# Patient Record
Sex: Male | Born: 1975 | Race: Black or African American | Hispanic: No | Marital: Married | Smoking: Former smoker
Health system: Southern US, Community
[De-identification: ages and names within clinical notes are randomized; demographics above are authoritative.]

## PROBLEM LIST (undated history)

## (undated) DIAGNOSIS — B019 Varicella without complication: Secondary | ICD-10-CM

## (undated) HISTORY — DX: Varicella without complication: B01.9

---

## 2007-09-30 ENCOUNTER — Emergency Department: Payer: Self-pay | Admitting: Emergency Medicine

## 2007-10-22 ENCOUNTER — Other Ambulatory Visit: Payer: Self-pay

## 2007-10-22 ENCOUNTER — Emergency Department: Payer: Self-pay | Admitting: Emergency Medicine

## 2008-12-11 ENCOUNTER — Emergency Department: Payer: Self-pay | Admitting: Emergency Medicine

## 2009-04-02 ENCOUNTER — Emergency Department: Payer: Self-pay | Admitting: Unknown Physician Specialty

## 2009-08-13 IMAGING — CT CT CERVICAL SPINE WITHOUT CONTRAST
1 series · 12 of 14 positions shown, 15 images · non-contrast
Comparison: none

REASON FOR EXAM: distracting injuries facial trauma
COMMENTS:   LMP: (Male)

[Series 4: axial · axial · 0.34mm/px · z∈[-277,-44]mm · 12 of 139 slices shown, 15 images]
[im 11/139  soft-tissue]
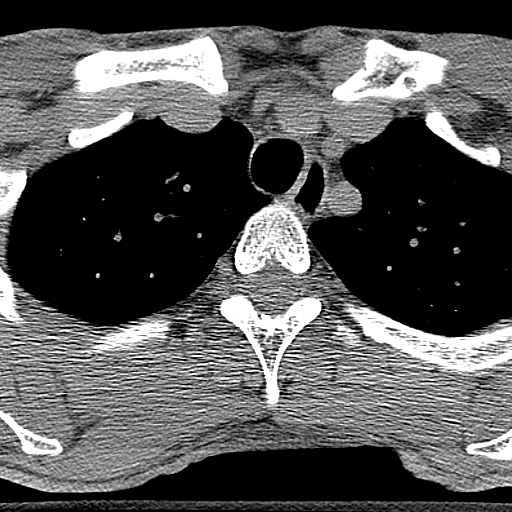
[im 11/139  bone]
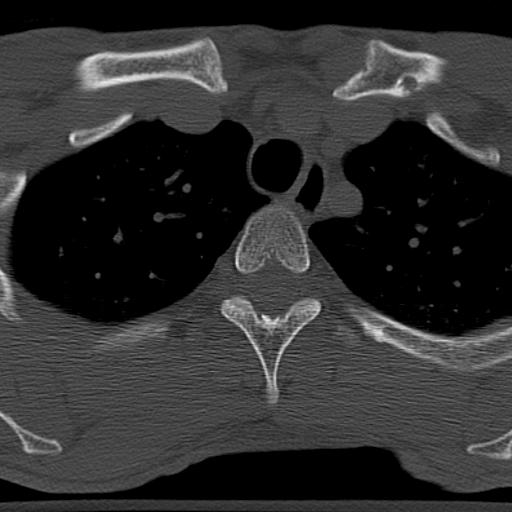
[im 22/139  bone]
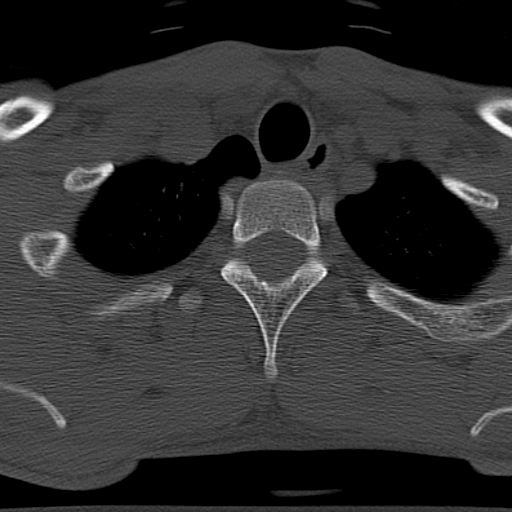
[im 32/139  bone]
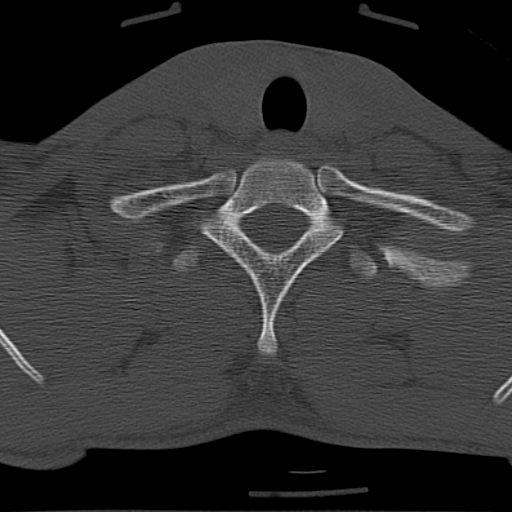
[im 43/139  bone]
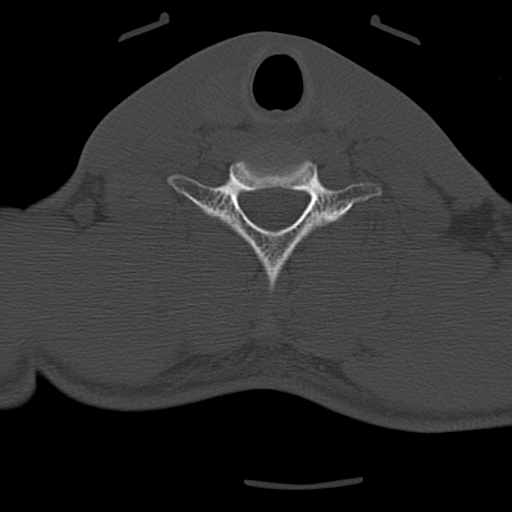
[im 54/139  soft-tissue]
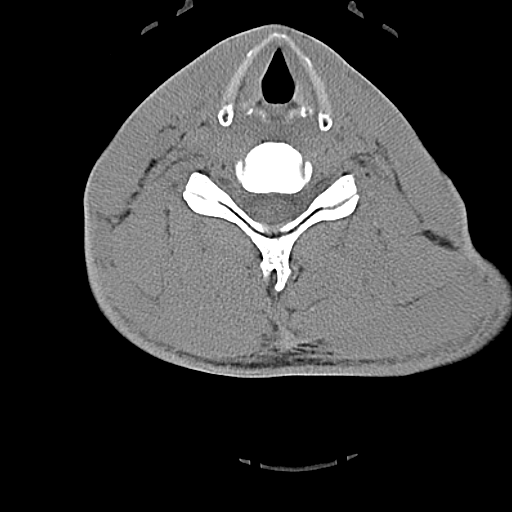
[im 54/139  bone]
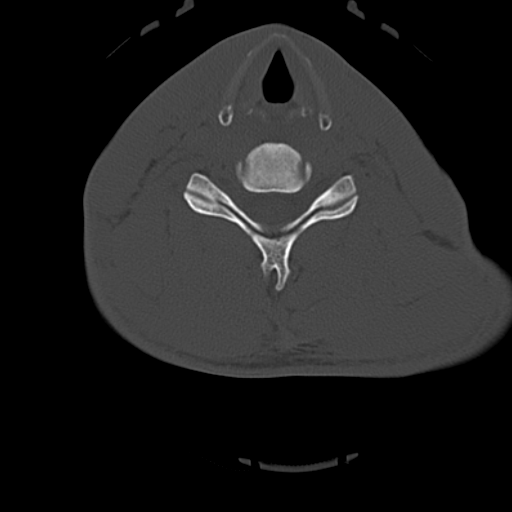
[im 64/139  bone]
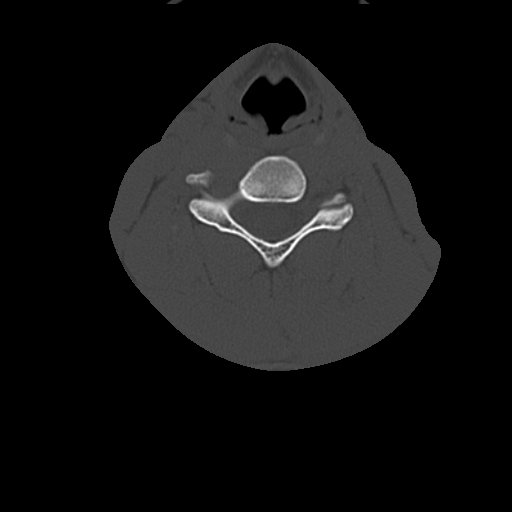
[im 75/139  bone]
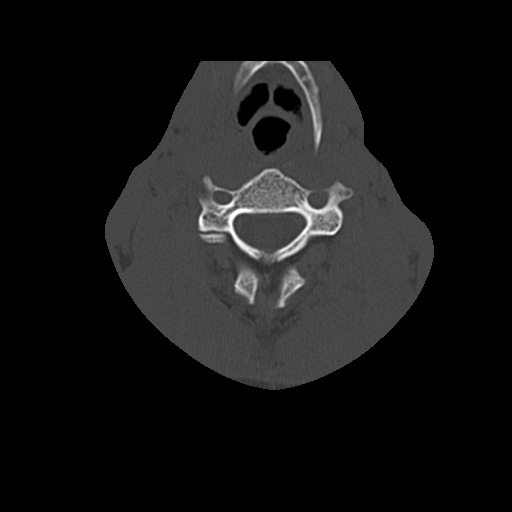
[im 85/139  bone]
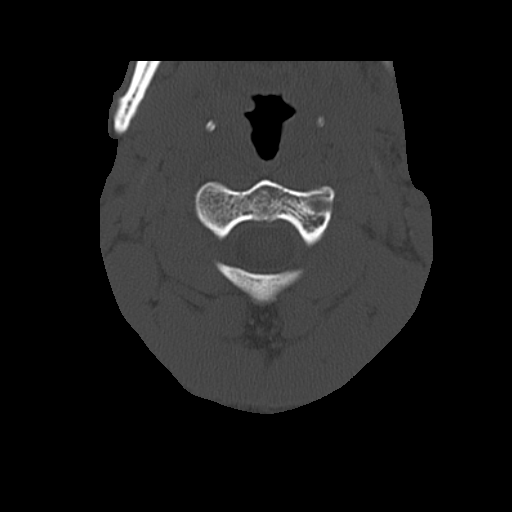
[im 96/139  soft-tissue]
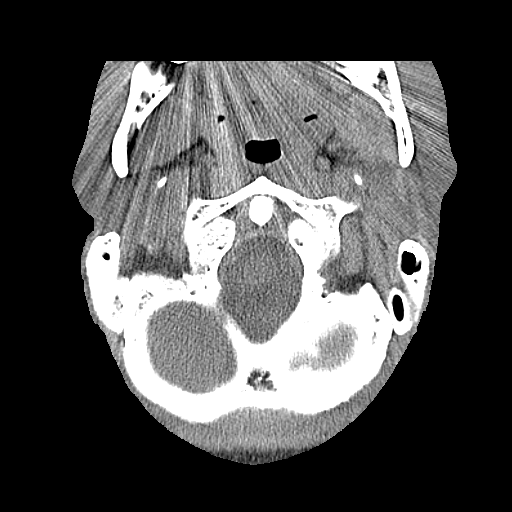
[im 96/139  bone]
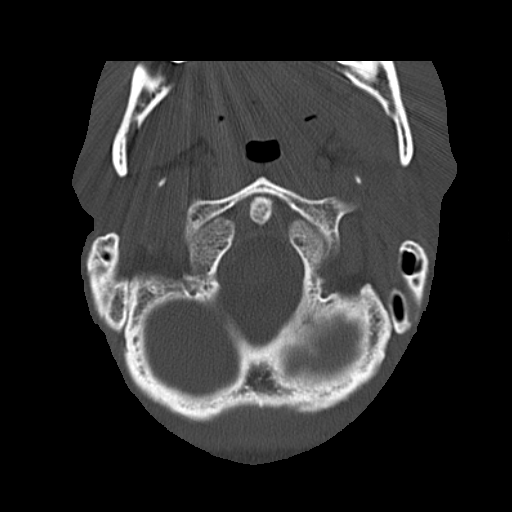
[im 107/139  bone]
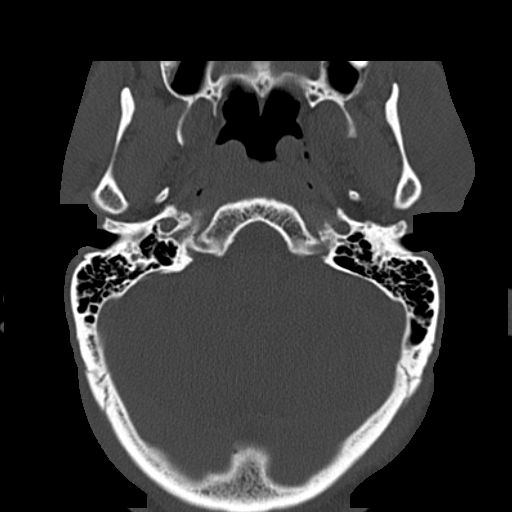
[im 117/139  bone]
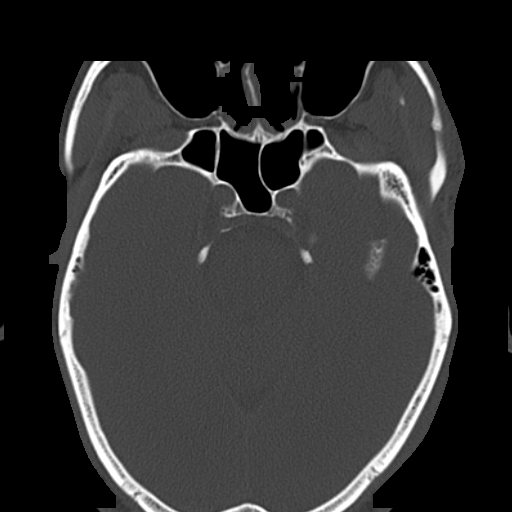
[im 128/139  bone]
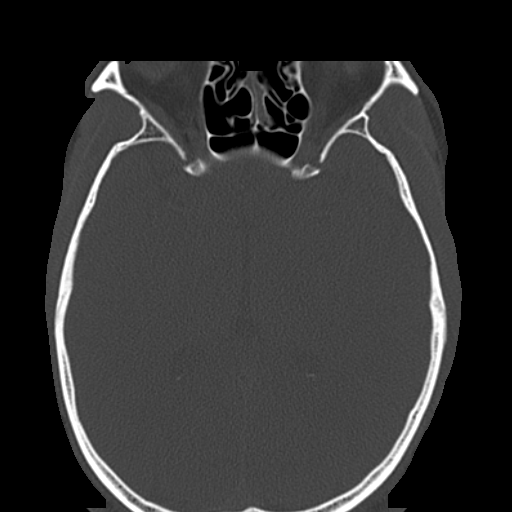

[12 of 14 positions shown; findings below may reference images not displayed]

PROCEDURE:     CT  - CT CERVICAL SPINE WO  - October 22, 2007  [DATE]

RESULT:     Noncontrast emergent CT of the cervical spine is reconstructed
at bone window settings in the axial, coronal and sagittal planes. There is
no previous exam for comparison.

The craniocervical junction is unremarkable. The prevertebral soft tissues
appear to be normal. There is no fracture.
IMPRESSION: No CT evidence of an acute cervical spine bony abnormality.
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
## 2010-12-23 ENCOUNTER — Emergency Department: Payer: Self-pay | Admitting: Emergency Medicine

## 2011-03-20 ENCOUNTER — Emergency Department: Payer: Self-pay | Admitting: Emergency Medicine

## 2012-12-05 ENCOUNTER — Encounter: Payer: Self-pay | Admitting: Adult Health

## 2012-12-05 ENCOUNTER — Ambulatory Visit (INDEPENDENT_AMBULATORY_CARE_PROVIDER_SITE_OTHER): Payer: 59 | Admitting: Adult Health

## 2012-12-05 ENCOUNTER — Encounter: Payer: Self-pay | Admitting: *Deleted

## 2012-12-05 VITALS — BP 118/78 | HR 60 | Temp 98.1°F | Resp 12 | Ht 71.75 in | Wt 165.0 lb

## 2012-12-05 DIAGNOSIS — M79609 Pain in unspecified limb: Secondary | ICD-10-CM

## 2012-12-05 DIAGNOSIS — M79671 Pain in right foot: Secondary | ICD-10-CM | POA: Insufficient documentation

## 2012-12-05 DIAGNOSIS — Z0000 Encounter for general adult medical examination without abnormal findings: Secondary | ICD-10-CM | POA: Insufficient documentation

## 2012-12-05 DIAGNOSIS — L84 Corns and callosities: Secondary | ICD-10-CM

## 2012-12-05 LAB — LIPID PANEL
Total CHOL/HDL Ratio: 3
Triglycerides: 50 mg/dL (ref 0.0–149.0)

## 2012-12-05 LAB — BASIC METABOLIC PANEL
CO2: 31 mEq/L (ref 19–32)
Chloride: 107 mEq/L (ref 96–112)
Creatinine, Ser: 1.2 mg/dL (ref 0.4–1.5)
Potassium: 4.3 mEq/L (ref 3.5–5.1)

## 2012-12-05 LAB — HEPATIC FUNCTION PANEL
ALT: 22 U/L (ref 0–53)
AST: 21 U/L (ref 0–37)
Alkaline Phosphatase: 55 U/L (ref 39–117)
Bilirubin, Direct: 0.1 mg/dL (ref 0.0–0.3)
Total Protein: 6.7 g/dL (ref 6.0–8.3)

## 2012-12-05 LAB — CBC WITH DIFFERENTIAL/PLATELET
Basophils Relative: 0.4 % (ref 0.0–3.0)
Eosinophils Absolute: 0.4 10*3/uL (ref 0.0–0.7)
Lymphs Abs: 2.4 10*3/uL (ref 0.7–4.0)
MCHC: 33.3 g/dL (ref 30.0–36.0)
MCV: 85.7 fl (ref 78.0–100.0)
Monocytes Absolute: 0.5 10*3/uL (ref 0.1–1.0)
Neutrophils Relative %: 46.9 % (ref 43.0–77.0)
Platelets: 181 10*3/uL (ref 150.0–400.0)
RBC: 4.94 Mil/uL (ref 4.22–5.81)

## 2012-12-05 MED ORDER — MELOXICAM 15 MG PO TABS: 15.0000 mg | ORAL_TABLET | Freq: Every day | ORAL | Status: DC

## 2012-12-05 NOTE — Assessment & Plan Note (Signed)
Patient with multiple calluses and corns on bilateral feet. He also has bilateral bunions. He has seen a podiatrist in [facility] however he would like to be established with one locally. Patient is experiencing considerable pain on a regular basis. Start meloxicam 15 mg daily. Refer to podiatry. Patient requesting referral to Dr. Orland Jarred.

## 2012-12-05 NOTE — Assessment & Plan Note (Signed)
Normal physical exam except for other problems listed separately. Check labs: CBC with differential, basic metabolic panel, hepatic panel, lipids.

## 2012-12-05 NOTE — Progress Notes (Signed)
Subjective:    Patient ID: Danny Patton, male    DOB: 1975-08-13, 37 y.o.   MRN: [id number]  HPI  Patient is a pleasant 37 year old male who presents to clinic to establish care. He has not had any regular PCP since his pediatrician. Patient is having significant pain in bilateral feet. He has been followed by a podiatrist in [facility] but he finds it difficult to be seen at this clinic. He is requesting a referral to a local podiatrist.    Past Medical History  Diagnosis Date  . Chicken pox     History reviewed. No pertinent past surgical history.   Family History  Problem Relation Age of Onset  . Stroke Maternal Grandmother   . Heart disease Paternal Grandfather      History   Social History  . Marital Status: Married    Spouse Name: N/A    Number of Children: 2  . Years of Education: 14   Occupational History  . Maintenance     [employer]   Social History Main Topics  . Smoking status: Former Smoker    Quit date: 03/30/1997  . Smokeless tobacco: Never Used  . Alcohol Use: 2.4 oz/week    4 Cans of beer per week     Comment: occasionally  . Drug Use: No  . Sexual Activity: Yes   Other Topics Concern  . Not on file   Social History Narrative   [name] grew up in [place], [place]. He attended [employer] and obtained an Associates degree in [place].  He has 2 biological children from a previous relationship (27 y/o girl who lives with her mother & 7 month of girl from his current marriage). He lives at home with his wife and her 28 & 33 y/o daughters and their 39 month old daughter. His wife also has a 48 y/o daughter who lives with a cousin and a 64 y/o son who lives with his dad. All live in [place] and they see each other frequently.  [name] works in Production designer, theatre/television/film for [employer]. He has not been able to find employment in his field of study.       Review of Systems  Constitutional: Negative.   HENT: Negative.   Eyes:  Negative.   Respiratory: Negative.   Cardiovascular: Negative.   Gastrointestinal: Negative.   Endocrine: Negative.   Genitourinary: Negative.   Musculoskeletal:       Bilateral feet pain. Multiple corns and callus  Skin: Negative.   Allergic/Immunologic:       Mild seasonal allergies  Neurological: Negative.   Hematological: Negative.   Psychiatric/Behavioral: Negative.    BP 118/78  Pulse 60  Temp(Src) 98.1 F (36.7 C) (Oral)  Resp 12  Ht 5' 11.75" (1.822 m)  Wt 165 lb (74.844 kg)  BMI 22.55 kg/m2  SpO2 98%     Objective:   Physical Exam  Constitutional: He is oriented to person, place, and time. He appears well-developed and well-nourished. No distress.  HENT:  Head: Normocephalic and atraumatic.  Right Ear: External ear normal.  Left Ear: External ear normal.  Nose: Nose normal.  Mouth/Throat: Oropharynx is clear and moist.  Eyes: Conjunctivae and EOM are normal. Pupils are equal, round, and reactive to light.  Neck: Normal range of motion. Neck supple.  Cardiovascular: Normal rate, regular rhythm, normal heart sounds and intact distal pulses.  Exam reveals no gallop and no friction rub.   No murmur heard. Pulmonary/Chest: Effort normal  and breath sounds normal. No respiratory distress. He has no wheezes. He has no rales. He exhibits no tenderness.  Abdominal: Soft. Bowel sounds are normal.  Musculoskeletal: Normal range of motion. He exhibits no edema and no tenderness.  Lymphadenopathy:    He has no cervical adenopathy.  Neurological: He is alert and oriented to person, place, and time. He has normal reflexes.  Skin: Skin is warm and dry.     3 tattoos done himself. Did not share needles with anyone.  Psychiatric: He has a normal mood and affect. His behavior is normal. Judgment and thought content normal.       Assessment & Plan:

## 2012-12-05 NOTE — Patient Instructions (Addendum)
   Thank you for choosing [facility] for your health care needs.  Please have your labs drawn prior to leaving the office.  The results will be available through MyChart for your convenience. Please remember to activate this. The activation code is located at the end of this form.  I have sent in a prescription for Mobic to help with your feet pain.  I am also referring your to Dr. Orland Jarred (Podiatry) and the local Pain Clinic.  Please call with any questions or concerns.

## 2013-01-05 ENCOUNTER — Ambulatory Visit: Payer: Self-pay | Admitting: Pain Medicine

## 2013-01-06 ENCOUNTER — Telehealth: Payer: Self-pay | Admitting: Adult Health

## 2013-01-06 NOTE — Telephone Encounter (Signed)
Asking if we have heard from [facility], Dr. Metta Clines.  States they were going to fax [place] paperwork to have pt referred to them.  Pt wants to go ahead and be referred.

## 2013-01-06 NOTE — Telephone Encounter (Signed)
Do you know anything about this? 

## 2013-01-09 NOTE — Telephone Encounter (Signed)
I called patient to clarify what was going on. Patient states he seen Dr. Metta Clines who told him that he should go to [facility] for further eval. He also stated Dr. Metta Clines told him we should set this up. Please advise.

## 2013-01-11 NOTE — Telephone Encounter (Signed)
Left message for pt to return my call.

## 2013-01-11 NOTE — Telephone Encounter (Signed)
Pt saw Dr. Metta Clines, recommended referral to [facility], but his office did not make referral, requesting [place] to. States the pain in his feet is beginning to cause back and neck pain as well and that is why Dr. Metta Clines would like to refer him to [facility]. Advised pt that Raquel was out of the office this week and he is fine with waiting.

## 2013-01-11 NOTE — Telephone Encounter (Signed)
Pt returned call

## 2013-01-16 ENCOUNTER — Other Ambulatory Visit: Payer: Self-pay | Admitting: Adult Health

## 2013-01-16 DIAGNOSIS — G8929 Other chronic pain: Secondary | ICD-10-CM

## 2013-01-16 NOTE — Telephone Encounter (Signed)
Referral entered for [facility]

## 2013-01-25 ENCOUNTER — Telehealth: Payer: Self-pay | Admitting: Adult Health

## 2013-01-25 NOTE — Telephone Encounter (Signed)
LVM for [facility] to return my call

## 2013-01-25 NOTE — Telephone Encounter (Signed)
Pt calling to check on referral to [facility].

## 2013-01-25 NOTE — Telephone Encounter (Signed)
Notified pt that Amber had left a message for [facility] today and that we would notify him when she hears about an appointment. Verbalized understanding.

## 2013-03-20 ENCOUNTER — Ambulatory Visit: Payer: 59 | Admitting: Adult Health

## 2013-03-21 ENCOUNTER — Encounter: Payer: Self-pay | Admitting: Adult Health

## 2013-03-21 ENCOUNTER — Ambulatory Visit (INDEPENDENT_AMBULATORY_CARE_PROVIDER_SITE_OTHER): Payer: 59 | Admitting: Adult Health

## 2013-03-21 ENCOUNTER — Encounter (INDEPENDENT_AMBULATORY_CARE_PROVIDER_SITE_OTHER): Payer: Self-pay

## 2013-03-21 VITALS — BP 120/72 | HR 80 | Temp 97.8°F | Resp 14 | Ht 73.0 in | Wt 165.0 lb

## 2013-03-21 DIAGNOSIS — Z0289 Encounter for other administrative examinations: Secondary | ICD-10-CM

## 2013-03-21 DIAGNOSIS — R300 Dysuria: Secondary | ICD-10-CM | POA: Insufficient documentation

## 2013-03-21 DIAGNOSIS — Z021 Encounter for pre-employment examination: Secondary | ICD-10-CM | POA: Insufficient documentation

## 2013-03-21 LAB — POCT URINALYSIS DIPSTICK
Ketones, UA: NEGATIVE
Protein, UA: NEGATIVE
Spec Grav, UA: >=1.03
Urobilinogen, UA: 0.2
pH, UA: 6

## 2013-03-21 LAB — LIPID PANEL
Cholesterol: 105 mg/dL (ref 0–200)
LDL Cholesterol: 64 mg/dL (ref 0–99)
Triglycerides: 48 mg/dL (ref 0.0–149.0)
VLDL: 9.6 mg/dL (ref 0.0–40.0)

## 2013-03-21 MED ORDER — LEVOFLOXACIN 500 MG PO TABS: ORAL_TABLET | ORAL | Status: DC

## 2013-03-21 NOTE — Assessment & Plan Note (Signed)
Patient needing form filled out for insurance purposes for employer. Need to draw glucose and cholesterol even though this was done in September when he had his physical. Form will be completed once results obtained.

## 2013-03-21 NOTE — Progress Notes (Signed)
   Subjective:    Patient ID: Danny Patton, male    DOB: November 17, 1975, 37 y.o.   MRN: [id number]  HPI  Danny Patton is a pleasant 37 year old male who presents to clinic for the following reasons:  1. He needs forms filled out for his employer. This is for insurance purposes in order to decrease his premiums. 2. He is having dysuria. Reports frequency and urgency. Denies fever, chills, discharge. He is sexually active with multiple partners.  Current Outpatient Prescriptions on File Prior to Visit  Medication Sig Dispense Refill  . meloxicam (MOBIC) 15 MG tablet Take 1 tablet (15 mg total) by mouth daily.  30 tablet  0   No current facility-administered medications on file prior to visit.     Review of Systems  Constitutional: Negative.   Respiratory: Negative.   Cardiovascular: Negative.   Gastrointestinal: Negative.   Genitourinary: Positive for dysuria, urgency and frequency. Negative for hematuria, flank pain and discharge.  Psychiatric/Behavioral: Negative.   All other systems reviewed and are negative.       Objective:   Physical Exam  Constitutional: He is oriented to person, place, and time. He appears well-developed and well-nourished. No distress.  Cardiovascular: Normal rate, regular rhythm, normal heart sounds and intact distal pulses.  Exam reveals no gallop and no friction rub.   No murmur heard. Pulmonary/Chest: Effort normal and breath sounds normal. No respiratory distress. He has no wheezes. He has no rales. He exhibits no tenderness.  Abdominal: Soft. Bowel sounds are normal.  Genitourinary:  Urinary frequency, hesitancy and urgency. No penile discharge.  Neurological: He is alert and oriented to person, place, and time.  Skin: Skin is warm and dry.  Psychiatric: He has a normal mood and affect. His behavior is normal. Judgment and thought content normal.    BP 120/72  Pulse 80  Temp(Src) 97.8 F (36.6 C) (Oral)  Resp 14  Ht 6\' 1"  (1.854 m)  Wt 165  lb (74.844 kg)  BMI 21.77 kg/m2  SpO2 97%        Assessment & Plan:

## 2013-03-21 NOTE — Assessment & Plan Note (Addendum)
Check UA, gonorrhea/chlamidia. Send urine for culture. Start levaquin 500 mg daily x 14 days.

## 2013-03-21 NOTE — Progress Notes (Signed)
Pre visit review using our clinic review tool, if applicable. No additional management support is needed unless otherwise documented below in the visit note. 

## 2013-03-22 LAB — URINE CULTURE: Organism ID, Bacteria: NO GROWTH

## 2017-03-21 ENCOUNTER — Emergency Department
Admission: EM | Admit: 2017-03-21 | Discharge: 2017-03-21 | Disposition: A | Discharge status: Home / Self Care | Payer: Self-pay | Attending: Emergency Medicine | Admitting: Emergency Medicine

## 2017-03-21 ENCOUNTER — Encounter: Payer: Self-pay | Admitting: Emergency Medicine

## 2017-03-21 ENCOUNTER — Other Ambulatory Visit: Payer: Self-pay

## 2017-03-21 DIAGNOSIS — R451 Restlessness and agitation: Secondary | ICD-10-CM | POA: Insufficient documentation

## 2017-03-21 DIAGNOSIS — Y902 Blood alcohol level of 40-59 mg/100 ml: Secondary | ICD-10-CM | POA: Insufficient documentation

## 2017-03-21 DIAGNOSIS — Z7289 Other problems related to lifestyle: Secondary | ICD-10-CM

## 2017-03-21 DIAGNOSIS — Z791 Long term (current) use of non-steroidal anti-inflammatories (NSAID): Secondary | ICD-10-CM | POA: Insufficient documentation

## 2017-03-21 DIAGNOSIS — Z87891 Personal history of nicotine dependence: Secondary | ICD-10-CM | POA: Insufficient documentation

## 2017-03-21 DIAGNOSIS — Z789 Other specified health status: Secondary | ICD-10-CM

## 2017-03-21 DIAGNOSIS — R4689 Other symptoms and signs involving appearance and behavior: Secondary | ICD-10-CM | POA: Insufficient documentation

## 2017-03-21 DIAGNOSIS — F1010 Alcohol abuse, uncomplicated: Secondary | ICD-10-CM | POA: Insufficient documentation

## 2017-03-21 LAB — URINALYSIS, ROUTINE W REFLEX MICROSCOPIC
Bilirubin Urine: NEGATIVE
GLUCOSE, UA: NEGATIVE mg/dL
HGB URINE DIPSTICK: NEGATIVE
Ketones, ur: NEGATIVE mg/dL
LEUKOCYTES UA: NEGATIVE
NITRITE: NEGATIVE
PROTEIN: 100 mg/dL — AB
SPECIFIC GRAVITY, URINE: 1.02 (ref 1.005–1.030)
pH: 6 (ref 5.0–8.0)

## 2017-03-21 LAB — COMPREHENSIVE METABOLIC PANEL
ALT: 35 U/L (ref 17–63)
ANION GAP: 17 — AB (ref 5–15)
AST: 35 U/L (ref 15–41)
Albumin: 4.3 g/dL (ref 3.5–5.0)
Alkaline Phosphatase: 58 U/L (ref 38–126)
BILIRUBIN TOTAL: 0.3 mg/dL (ref 0.3–1.2)
BUN: 17 mg/dL (ref 6–20)
CHLORIDE: 105 mmol/L (ref 101–111)
CO2: 16 mmol/L — ABNORMAL LOW (ref 22–32)
Calcium: 8.8 mg/dL — ABNORMAL LOW (ref 8.9–10.3)
Creatinine, Ser: 1.45 mg/dL — ABNORMAL HIGH (ref 0.61–1.24)
GFR calc Af Amer: >60 mL/min (ref >60)
GFR, EST NON AFRICAN AMERICAN: 60 mL/min — AB (ref >60)
Glucose, Bld: 189 mg/dL — ABNORMAL HIGH (ref 65–99)
POTASSIUM: 3.2 mmol/L — AB (ref 3.5–5.1)
Sodium: 138 mmol/L (ref 135–145)
TOTAL PROTEIN: 7 g/dL (ref 6.5–8.1)

## 2017-03-21 LAB — CBC
HEMATOCRIT: 42.7 % (ref 40.0–52.0)
Hemoglobin: 14.3 g/dL (ref 13.0–18.0)
MCH: 29.1 pg (ref 26.0–34.0)
MCHC: 33.6 g/dL (ref 32.0–36.0)
MCV: 86.6 fL (ref 80.0–100.0)
PLATELETS: 173 10*3/uL (ref 150–440)
RBC: 4.93 MIL/uL (ref 4.40–5.90)
RDW: 14.9 % — AB (ref 11.5–14.5)
WBC: 10.1 10*3/uL (ref 3.8–10.6)

## 2017-03-21 LAB — URINE DRUG SCREEN, QUALITATIVE (ARMC ONLY)
Amphetamines, Ur Screen: NOT DETECTED
BARBITURATES, UR SCREEN: NOT DETECTED
BENZODIAZEPINE, UR SCRN: NOT DETECTED
CANNABINOID 50 NG, UR ~~LOC~~: POSITIVE — AB
COCAINE METABOLITE, UR ~~LOC~~: POSITIVE — AB
MDMA (Ecstasy)Ur Screen: NOT DETECTED
METHADONE SCREEN, URINE: NOT DETECTED
Opiate, Ur Screen: NOT DETECTED
Phencyclidine (PCP) Ur S: NOT DETECTED
TRICYCLIC, UR SCREEN: NOT DETECTED

## 2017-03-21 LAB — ETHANOL: ALCOHOL ETHYL (B): 56 mg/dL — AB (ref <10)

## 2017-03-21 LAB — ACETAMINOPHEN LEVEL

## 2017-03-21 LAB — SALICYLATE LEVEL

## 2017-03-21 MED ORDER — LORAZEPAM 2 MG/ML IJ SOLN
2.0000 mg | Freq: Once | INTRAMUSCULAR | Status: AC
  Administered 2017-03-21: 2 mg via INTRAMUSCULAR

## 2017-03-21 MED ORDER — ZIPRASIDONE MESYLATE 20 MG IM SOLR
20.0000 mg | Freq: Once | INTRAMUSCULAR | Status: AC
  Administered 2017-03-21: 20 mg via INTRAMUSCULAR

## 2017-03-21 NOTE — ED Notes (Signed)
BEHAVIORAL HEALTH ROUNDING Patient sleeping: Yes.   Patient alert and oriented: not applicable SLEEPING Behavior appropriate: Yes.  ; If no, describe: SLEEPING Nutrition and fluids offered: No SLEEPING Toileting and hygiene offered: NoSLEEPING Sitter present: not applicable, Q 15 min safety rounds and observation. Law enforcement present: Yes ODS 

## 2017-03-21 NOTE — BH Assessment (Signed)
Pt administered IM sedation for aggression.  TTS unable to be completed at this time.

## 2017-03-21 NOTE — ED Provider Notes (Signed)
[facility]  Time seen: 12:32 AM  I have reviewed the triage vital signs and the nursing notes.   HISTORY  Chief Complaint No chief complaint on file.    HPI Danny Patton is a 41 y.o. male with no known past medical history who presents to the emergency department with agitation.  According to the patient he came to the ER because he just was not feeling right, per staff the patient was pacing around the emergency department waiting room and became agitated and started yelling, ripped his coat off and ran outside to the EMS bay continued yelling and agitation.  Police officers intervened and brought the patient into the emergency department in handcuffs.  Patient does appear acutely agitated, denies any psychiatric history.  Admits to alcohol use and some drug use but will not elaborate as to how much he drank or what drugs he might of taken.  Patient denies any chest pain abdominal pain, negative review of systems.  Denies any psychiatric history.  Patient initially calm and cooperative while in handcuffs however once ankles were removed patient once again became very agitated yelling loudly saying he needs to get some fresh air trying to leave the room, officers once again intervene.   Past Medical History:  Diagnosis Date  . Chicken pox     Patient Active Problem List   Diagnosis Date Noted  . Dysuria 03/21/2013  . Pre-employment examination 03/21/2013  . Routine general medical examination at a health care facility 12/05/2012  . Pain in both feet 12/05/2012    No past surgical history on file.  Prior to Admission medications   Medication Sig Start Date End Date Taking? Authorizing Provider  levofloxacin (LEVAQUIN) 500 MG tablet Take 1 tablet daily for 14 days. 03/21/13   Rey, Richarda Overlieaquel M, NP  meloxicam (MOBIC) 15 MG tablet Take 1 tablet (15 mg total) by mouth daily. 12/05/12   Rey, Richarda Overlieaquel M, NP    No Known  Allergies  Family History  Problem Relation Age of Onset  . Stroke Maternal Grandmother   . Heart disease Paternal Grandfather     Social History Social History   Tobacco Use  . Smoking status: Former Smoker    Last attempt to quit: 03/30/1997    Years since quitting: 19.9  . Smokeless tobacco: Never Used  Substance Use Topics  . Alcohol use: Yes    Alcohol/week: 2.4 oz    Types: 4 Cans of beer per week    Comment: occasionally  . Drug use: No    Review of Systems Constitutional: Negative for fever. Cardiovascular: Negative for chest pain. Respiratory: Negative for shortness of breath. Gastrointestinal: Negative for abdominal pain Neurological: Negative for headache All other ROS negative  ____________________________________________   PHYSICAL EXAM:  Constitutional: Alert, initially calm and cooperative while lying in bed in handcuffs.  Once an cost removed patient became acutely agitated screaming loudly although not threatening anybody. Eyes: Normal exam ENT   Head: Normocephalic and atraumatic.   Mouth/Throat: Mucous membranes are moist. Cardiovascular: Normal rate, regular rhythm. No murmur Respiratory: Normal respiratory effort without tachypnea nor retractions. Breath sounds are clear  Gastrointestinal: Soft and nontender. No distention. Musculoskeletal: Nontender with normal range of motion in all extremities.  Neurologic:  Normal speech and language. No gross focal neurologic deficits  Skin:  Skin is warm, dry and intact.  Psychiatric: Mood and affect are normal.  ____________________________________________   INITIAL IMPRESSION / ASSESSMENT AND PLAN /  ED COURSE  Pertinent labs & imaging results that were available during my care of the patient were reviewed by me and considered in my medical decision making (see chart for details).  Patient presents to the emergency department with agitation/combative behavior.  Patient was taken into the ED in  handcuffs.  Differential would include intoxication, substance use/abuse, psychiatric illness.  We will place the patient under IVC.  Patient very agitated currently we will dose 20 mg of Geodon and 2 mg of Ativan IM for patient sedation.  EKG reviewed and interpreted by myself shows sinus tachycardia at 104 bpm with a narrow QRS, normal axis, largely normal intervals with nonspecific ST changes.  No ST elevation.  Patient's blood work has resulted with ethanol level of 56, mild anion gap, we will encourage oral hydration.  Possible alcoholic ketoacidosis.  Awaiting urinalysis and urine drug screen.   Patient continues to rest comfortably, awaiting psychiatric evaluation. ____________________________________________   FINAL CLINICAL IMPRESSION(S) / ED DIAGNOSES  Combative behavior Agitation    Minna AntisPaduchowski, Elise Knobloch, MD 03/21/17 [phone]

## 2017-03-21 NOTE — ED Notes (Signed)
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
## 2017-03-21 NOTE — ED Provider Notes (Signed)
-----------------------------------------   8:12 AM on 03/21/2017 -----------------------------------------   Blood pressure (!) 135/92, pulse 85, temperature 98 F (36.7 C), temperature source Oral, resp. rate 18, SpO2 97 %.  The patient had no acute events since last update.  Calm and cooperative at this time.  Patient is clinically sober at this time.  Is not reporting any suicidal or homicidal ideation.  He says that he usually does not use drugs regularly and thinks that he overdid it last night.  Patient also evaluated by specialist on-call psychiatry who deemed the patient appropriate for outpatient treatment.  Dr. Jonelle SidleBreuer reversed ivc.      Danny Patton, Danny Timmons Matthew, MD 03/21/17 [phone]

## 2017-03-21 NOTE — ED Notes (Signed)
BEHAVIORAL HEALTH ROUNDING  Patient sleeping: No.  Patient alert and oriented: yes  Behavior appropriate: Yes. ; If no, describe:  Nutrition and fluids offered: Yes  Toileting and hygiene offered: Yes  Sitter present: not applicable, Q 15 min safety rounds and observation.  Law enforcement present: Yes ODS  

## 2017-03-21 NOTE — ED Notes (Signed)
All of pt's belongings returned prior to discharge.

## 2017-03-21 NOTE — ED Triage Notes (Signed)
Pt brought to Rm 21 from outside. EMS requested police to come to the parking lot due to person acting strange and running aggressively towards them (EMS). Pt arrives talking loudly and saying he needs help. Dr Lenard LancePaduchowski to room and pt pacing at that time and admitting to ETOH. Pt refusing to allow blood draw or to change clothes at this time. Pt continues to be loud and aggressive acting coming towards staff.

## 2017-03-21 NOTE — ED Notes (Signed)
ENVIRONMENTAL ASSESSMENT  Potentially harmful objects out of patient reach: Yes.  Personal belongings secured: Yes.  Patient dressed in hospital provided attire only: Yes.  Plastic bags out of patient reach: Yes.  Patient care equipment (cords, cables, call bells, lines, and drains) shortened, removed, or accounted for: Yes.  Equipment and supplies removed from bottom of stretcher: Yes.  Potentially toxic materials out of patient reach: Yes.  Sharps container removed or out of patient reach: Yes.   BEHAVIORAL HEALTH ROUNDING  Patient sleeping: No.  Patient alert and oriented: yes  Behavior appropriate: no ; If no, describe: aggressive fighting with staff Nutrition and fluids offered: Yes  Toileting and hygiene offered: Yes  Sitter present: not applicable, Q 15 min safety rounds and observation.  Law enforcement present: Yes ODS

## 2017-08-28 ENCOUNTER — Encounter: Payer: Self-pay | Admitting: Emergency Medicine

## 2017-08-28 ENCOUNTER — Emergency Department
Admission: EM | Admit: 2017-08-28 | Discharge: 2017-08-28 | Disposition: A | Discharge status: Home / Self Care | Payer: Self-pay | Attending: Emergency Medicine | Admitting: Emergency Medicine

## 2017-08-28 ENCOUNTER — Other Ambulatory Visit: Payer: Self-pay

## 2017-08-28 DIAGNOSIS — Z79899 Other long term (current) drug therapy: Secondary | ICD-10-CM | POA: Insufficient documentation

## 2017-08-28 DIAGNOSIS — Z87891 Personal history of nicotine dependence: Secondary | ICD-10-CM | POA: Insufficient documentation

## 2017-08-28 DIAGNOSIS — R1310 Dysphagia, unspecified: Secondary | ICD-10-CM | POA: Insufficient documentation

## 2017-08-28 MED ORDER — OMEPRAZOLE MAGNESIUM 20 MG PO TBEC: 20.0000 mg | DELAYED_RELEASE_TABLET | Freq: Every day | ORAL | 0 refills | Status: AC

## 2017-08-28 NOTE — ED Provider Notes (Signed)
[facility] Emergency Department Provider Note ____________________________________________  Time seen:1120  I have reviewed the triage vital signs and the nursing notes.  HISTORY  Chief Complaint  Sore Throat   HPI Danny Patton is a 42 y.o. male presents to the ER with complaint of difficulty swallowing.  He reports this started 2 to 3 days ago.  He reports difficulty swallowing liquids and food.  He reports it will start to go down, but has a sensation that it has gotten stuck.  He denies sore throat.  He has had some intermittent reflux, but none in the last 2 to 3 days.  He has not choked on his food.  He denies nausea, vomiting, diarrhea or constipation.  He has not tried anything over-the-counter for his symptoms.  Past Medical History:  Diagnosis Date  . Chicken pox     Patient Active Problem List   Diagnosis Date Noted  . Dysuria 03/21/2013  . Pre-employment examination 03/21/2013  . Routine general medical examination at a health care facility 12/05/2012  . Pain in both feet 12/05/2012    History reviewed. No pertinent surgical history.  Prior to Admission medications   Medication Sig Start Date End Date Taking? Authorizing Provider  levofloxacin (LEVAQUIN) 500 MG tablet Take 1 tablet daily for 14 days. 03/21/13   Rey, Richarda Overlie, NP  meloxicam (MOBIC) 15 MG tablet Take 1 tablet (15 mg total) by mouth daily. 12/05/12   Rey, Richarda Overlie, NP  omeprazole (PRILOSEC OTC) 20 MG tablet Take 1 tablet (20 mg total) by mouth daily. 08/28/17 08/28/18  Lorre Munroe, NP    Allergies Patient has no known allergies.  Family History  Problem Relation Age of Onset  . Stroke Maternal Grandmother   . Heart disease Paternal Grandfather     Social History Social History   Tobacco Use  . Smoking status: Former Smoker    Last attempt to quit: 03/30/1997    Years since quitting: 20.4  . Smokeless tobacco: Never Used  Substance Use Topics  . Alcohol use: Yes    Alcohol/week: 2.4 oz    Types: 4 Cans of beer per week    Comment: occasionally  . Drug use: No    Review of Systems  Constitutional: Negative for fever. ENT: Negative for sore throat. Cardiovascular: Negative for chest pain. Respiratory: Negative for shortness of breath. Gastrointestinal: Positive for difficulty swallowing.  Negative for abdominal pain, vomiting and diarrhea. ____________________________________________  PHYSICAL EXAM:  VITAL SIGNS: ED Triage Vitals  Enc Vitals Group     BP 08/28/17 1010 127/89     Pulse Rate 08/28/17 1010 71     Resp 08/28/17 1010 20     Temp 08/28/17 1010 97.9 F (36.6 C)     Temp Source 08/28/17 1010 Oral     SpO2 08/28/17 1010 100 %     Weight 08/28/17 1011 175 lb (79.4 kg)     Height 08/28/17 1011 6\' 1"  (1.854 m)     Head Circumference --      Peak Flow --      Pain Score 08/28/17 1011 6     Pain Loc --      Pain Edu? --      Excl. in GC? --     Constitutional: Alert and oriented. Well appearing and in no distress. Mouth/Throat: Mucous membranes are moist.  Tonsils not enlarged, no erythema. Neck: Supple. No thyromegaly. Hematological/Lymphatic/Immunological: No cervical lymphadenopathy. Cardiovascular: Normal rate, regular rhythm. Marland Kitchen Respiratory:  Normal respiratory effort. No wheezes/rales/rhonchi. Gastrointestinal: Soft and nontender. No distention.   INITIAL IMPRESSION / ASSESSMENT AND PLAN / ED COURSE  Dysphagia:  Rx provided for omeprazole 20 mg PO daily-take 30 minutes prior to breakfast Advised to follow-up with GI if symptoms persist or worsen ____________________________________________  FINAL CLINICAL IMPRESSION(S) / ED DIAGNOSES  Final diagnoses:  Dysphagia, unspecified type      Lorre MunroeBaity, Aniza Shor W, NP 08/28/17 1137    Jeanmarie PlantMcShane, James A, MD 08/28/17 1500

## 2017-08-28 NOTE — Discharge Instructions (Addendum)
And diagnosed with dysphagia.  You have been prescribed Omeprazole 20 mg daily -30 minutes before breakfast.  Take as instructed for 2 weeks.  If symptoms persist, please follow-up with gastroenterology.

## 2017-08-28 NOTE — ED Triage Notes (Signed)
Pain with swallowing food or fluid x 2 days. Denies fevers.

## 2017-08-28 NOTE — ED Notes (Signed)
Pt states when he eats or drinks anything that he has this weird feeling that the food isn't passing through. Pt states it doesn't hurt but is a discomfort.

## 2017-09-01 ENCOUNTER — Other Ambulatory Visit: Payer: Self-pay

## 2017-09-01 ENCOUNTER — Ambulatory Visit (INDEPENDENT_AMBULATORY_CARE_PROVIDER_SITE_OTHER): Payer: Self-pay | Admitting: Gastroenterology

## 2017-09-01 ENCOUNTER — Encounter: Payer: Self-pay | Admitting: Gastroenterology

## 2017-09-01 ENCOUNTER — Ambulatory Visit: Payer: Self-pay | Admitting: Gastroenterology

## 2017-09-01 VITALS — BP 145/97 | HR 84 | Resp 17 | Ht 73.0 in | Wt 182.0 lb

## 2017-09-01 DIAGNOSIS — R1310 Dysphagia, unspecified: Secondary | ICD-10-CM

## 2017-09-01 NOTE — Progress Notes (Signed)
Danny Repressohini R Ramzi Brathwaite, MD [address]  [address]  [place], [place] [address]  Main: [phone]  Fax: [phone]    Gastroenterology Consultation  Referring Provider:     No ref. provider found Primary Care Physician:  Novella Oliveey, Raquel M, NP Primary Gastroenterologist:  Dr. Arlyss Repressohini R Jahnay Lantier Reason for Consultation:     Atypical chest pain        HPI:   Danny Patton is a 42 y.o. male referred by Dr. Novella Oliveey, Raquel M, NP  for consultation & management of 1 week history of discomfort when he swallows liquids. He notices that whenever he tries to swallow liquids, he experiences something is rubbing against the esophagus, especially on the right side in mid chest and painful. He denies difficulty swallowing solids. He reports that this has been occurring every day and whenever he tries to drink liquids He denies nausea, vomiting, regurgitation, heartburn. His PCP started him on omeprazole 20 mg daily which he is taking after breakfast He used to smoke cigarettes, currently smoking cigars He denies weight loss  NSAIDs: none  Antiplts/Anticoagulants/Anti thrombotics: none  GI Procedures: none He denies family history of GI malignancy  Past Medical History:  Diagnosis Date  . Chicken pox     No past surgical history on file.  Current Outpatient Medications:  .  omeprazole (PRILOSEC OTC) 20 MG tablet, Take 1 tablet (20 mg total) by mouth daily., Disp: 30 tablet, Rfl: 0    Family History  Problem Relation Age of Onset  . Stroke Maternal Grandmother   . Heart disease Paternal Grandfather      Social History   Tobacco Use  . Smoking status: Former Smoker    Last attempt to quit: 03/30/1997    Years since quitting: 20.4  . Smokeless tobacco: Never Used  Substance Use Topics  . Alcohol use: Yes    Alcohol/week: 2.4 oz    Types: 4 Cans of beer per week    Comment: occasionally  . Drug use: No    Allergies as of 09/01/2017  . (No Known Allergies)    Review of Systems:     All systems reviewed and negative except where noted in HPI.   Physical Exam:  BP (!) 145/97 (BP Location: Left Arm, Patient Position: Sitting)   Pulse 84   Resp 17   Ht 6\' 1"  (1.854 m)   Wt 182 lb (82.6 kg)   BMI 24.01 kg/m  No LMP for male patient.  General:   Alert,  Well-developed, well-nourished, pleasant and cooperative in NAD Head:  Normocephalic and atraumatic. Eyes:  Sclera clear, no icterus.   Conjunctiva pink. Ears:  Normal auditory acuity. Nose:  No deformity, discharge, or lesions. Mouth:  No deformity or lesions,oropharynx pink & moist. Neck:  Supple; no masses or thyromegaly. Lungs:  Respirations even and unlabored.  Clear throughout to auscultation.   No wheezes, crackles, or rhonchi. No acute distress. Heart:  Regular rate and rhythm; no murmurs, clicks, rubs, or gallops. Abdomen:  Normal bowel sounds. Soft, non-tender and non-distended without masses, hepatosplenomegaly or hernias noted.  No guarding or rebound tenderness.   Rectal: Not performed Msk:  Symmetrical without gross deformities. Good, equal movement & strength bilaterally. Pulses:  Normal pulses noted. Extremities:  No clubbing or edema.  No cyanosis. Neurologic:  Alert and oriented x3;  grossly normal neurologically. Skin:  Intact without significant lesions or rashes. No jaundice. Psych:  Alert and cooperative. Normal mood and affect.  Imaging Studies: No  abdominal imaging  Assessment and Plan:   Danny Patton is a 42 y.o. African-American male with one-week history of discomfort in his mid chest with swallowing liquids but not solids. He is currently on omeprazole 20 mg daily He had a CT chest in 2009 which was unremarkable with no evidence of thoracic aortic aneurysm  Continue omeprazole 20 mg daily before breakfast Perform EGD to evaluate for eosinophilic esophagitis   Follow up after the EGD   Danny Repress, MD

## 2017-09-07 ENCOUNTER — Ambulatory Visit: Payer: Self-pay | Admitting: Anesthesiology

## 2017-09-07 ENCOUNTER — Encounter
Admission: RE | Disposition: A | Discharge status: Home / Self Care | Payer: Self-pay | Source: Ambulatory Visit | Attending: Gastroenterology

## 2017-09-07 ENCOUNTER — Ambulatory Visit
Admission: RE | Admit: 2017-09-07 | Discharge: 2017-09-07 | Disposition: A | Discharge status: Home / Self Care | Payer: Self-pay | Source: Ambulatory Visit | Attending: Gastroenterology | Admitting: Gastroenterology

## 2017-09-07 ENCOUNTER — Encounter: Payer: Self-pay | Admitting: *Deleted

## 2017-09-07 DIAGNOSIS — R1310 Dysphagia, unspecified: Secondary | ICD-10-CM

## 2017-09-07 DIAGNOSIS — R0789 Other chest pain: Secondary | ICD-10-CM

## 2017-09-07 DIAGNOSIS — K2950 Unspecified chronic gastritis without bleeding: Secondary | ICD-10-CM | POA: Insufficient documentation

## 2017-09-07 DIAGNOSIS — K3189 Other diseases of stomach and duodenum: Secondary | ICD-10-CM

## 2017-09-07 DIAGNOSIS — Z87891 Personal history of nicotine dependence: Secondary | ICD-10-CM | POA: Insufficient documentation

## 2017-09-07 DIAGNOSIS — Z8249 Family history of ischemic heart disease and other diseases of the circulatory system: Secondary | ICD-10-CM | POA: Insufficient documentation

## 2017-09-07 DIAGNOSIS — F1490 Cocaine use, unspecified, uncomplicated: Secondary | ICD-10-CM | POA: Insufficient documentation

## 2017-09-07 HISTORY — PX: ESOPHAGOGASTRODUODENOSCOPY (EGD) WITH PROPOFOL: SHX5813

## 2017-09-07 SURGERY — ESOPHAGOGASTRODUODENOSCOPY (EGD) WITH PROPOFOL
Anesthesia: General

## 2017-09-07 MED ORDER — DIPHENHYDRAMINE HCL 50 MG/ML IJ SOLN
INTRAMUSCULAR | Status: DC | PRN
  Administered 2017-09-07: 25 mg via INTRAVENOUS

## 2017-09-07 MED ORDER — PROPOFOL 500 MG/50ML IV EMUL
INTRAVENOUS | Status: AC
  Filled 2017-09-07: qty 50

## 2017-09-07 MED ORDER — LIDOCAINE HCL (CARDIAC) PF 100 MG/5ML IV SOSY
PREFILLED_SYRINGE | INTRAVENOUS | Status: DC | PRN
  Administered 2017-09-07: 50 mg via INTRAVENOUS

## 2017-09-07 MED ORDER — SODIUM CHLORIDE 0.9 % IV SOLN
INTRAVENOUS | Status: DC
  Administered 2017-09-07: 1000 mL via INTRAVENOUS

## 2017-09-07 MED ORDER — LIDOCAINE HCL (PF) 2 % IJ SOLN
INTRAMUSCULAR | Status: AC
  Filled 2017-09-07: qty 10

## 2017-09-07 MED ORDER — PROPOFOL 10 MG/ML IV BOLUS
INTRAVENOUS | Status: DC | PRN
  Administered 2017-09-07: 200 mg via INTRAVENOUS
  Administered 2017-09-07 (×4): 100 mg via INTRAVENOUS

## 2017-09-07 MED ORDER — GLYCOPYRROLATE 0.2 MG/ML IJ SOLN
INTRAMUSCULAR | Status: AC
  Filled 2017-09-07: qty 1

## 2017-09-07 MED ORDER — GLYCOPYRROLATE 0.2 MG/ML IJ SOLN
INTRAMUSCULAR | Status: DC | PRN
  Administered 2017-09-07: 0.2 mg via INTRAVENOUS

## 2017-09-07 NOTE — Anesthesia Postprocedure Evaluation (Signed)
Anesthesia Post Note  Patient: Susa SimmondsWayne T Gluth  Procedure(s) Performed: ESOPHAGOGASTRODUODENOSCOPY (EGD) WITH PROPOFOL (N/A )  Patient location during evaluation: Endoscopy Anesthesia Type: General Level of consciousness: awake and alert, oriented and patient cooperative Pain management: satisfactory to patient Vital Signs Assessment: post-procedure vital signs reviewed and stable Respiratory status: spontaneous breathing and respiratory function stable Cardiovascular status: blood pressure returned to baseline and stable Postop Assessment: no backache, no headache, patient able to bend at knees, no apparent nausea or vomiting, adequate PO intake and able to ambulate Anesthetic complications: no     Last Vitals:  Vitals:   09/07/17 1410 09/07/17 1700  BP: (!) 163/111 117/87  Pulse: 70 86  Resp: 20 (!) 35  Temp: (!) 35.4 C (!) 36.1 C  SpO2: 100% 93%    Last Pain:  Vitals:   09/07/17 1700  TempSrc: Tympanic  PainSc: Asleep                 Vonceil Upshur H Leea Rambeau

## 2017-09-07 NOTE — Op Note (Signed)
[facility] Gastroenterology Patient Name: Danny Patton Procedure Date: 09/07/2017 4:42 PM MRN: [id number] Account #: [id number] Date of Birth: 30-Nov-1975 Admit Type: Outpatient Age: 42 Room: [facility] ENDO ROOM 4 Gender: Male Note Status: Finalized Procedure:            Upper GI endoscopy Indications:          Chest pain (non cardiac) Providers:            Lin Landsman MD, MD Referring MD:         No Local Md, MD (Referring MD) Medicines:            Monitored Anesthesia Care Complications:        No immediate complications. Estimated blood loss: None. Procedure:            Pre-Anesthesia Assessment:                       - Prior to the procedure, a History and Physical was                        performed, and patient medications and allergies were                        reviewed. The patient is competent. The risks and                        benefits of the procedure and the sedation options and                        risks were discussed with the patient. All questions                        were answered and informed consent was obtained.                        Patient identification and proposed procedure were                        verified by the physician, the nurse, the                        anesthesiologist, the anesthetist and the technician in                        the pre-procedure area in the procedure room in the                        endoscopy suite. Mental Status Examination: alert and                        oriented. Airway Examination: normal oropharyngeal                        airway and neck mobility. Respiratory Examination:                        clear to auscultation. CV Examination: normal.                        Prophylactic Antibiotics: The patient does not require  prophylactic antibiotics. Prior Anticoagulants: The                        patient has taken no previous anticoagulant or                         antiplatelet agents. ASA Grade Assessment: II - A                        patient with mild systemic disease. After reviewing the                        risks and benefits, the patient was deemed in                        satisfactory condition to undergo the procedure. The                        anesthesia plan was to use monitored anesthesia care                        (MAC). Immediately prior to administration of                        medications, the patient was re-assessed for adequacy                        to receive sedatives. The heart rate, respiratory rate,                        oxygen saturations, blood pressure, adequacy of                        pulmonary ventilation, and response to care were                        monitored throughout the procedure. The physical status                        of the patient was re-assessed after the procedure.                       After obtaining informed consent, the endoscope was                        passed under direct vision. Throughout the procedure,                        the patient's blood pressure, pulse, and oxygen                        saturations were monitored continuously. The Endoscope                        was introduced through the mouth, and advanced to the                        second part of duodenum. The upper GI endoscopy was  accomplished without difficulty. The patient tolerated                        the procedure well. Findings:      The duodenal bulb and second portion of the duodenum were normal.      Patchy mildly erythematous mucosa without bleeding was found in the       gastric body and in the gastric antrum. Biopsies were taken with a cold       forceps for Helicobacter pylori testing.      The cardia and gastric fundus were normal on retroflexion.      The gastroesophageal junction and examined esophagus were normal.       Biopsies were taken with a cold forceps for  histology. Impression:           - Normal duodenal bulb and second portion of the                        duodenum.                       - Erythematous mucosa in the gastric body and antrum.                        Biopsied.                       - Normal gastroesophageal junction and esophagus.                        Biopsied. Recommendation:       - Await pathology results.                       - Discharge patient to home (with escort).                       - Resume previous diet today.                       - Continue present medications.                       - Return to my office as previously scheduled.                       - Abstinance from cocaine use, likely cause of atypical                        chest pain Procedure Code(s):    --- Professional ---                       [id number], Esophagogastroduodenoscopy, flexible, transoral;                        with biopsy, single or multiple Diagnosis Code(s):    --- Professional ---                       K31.89, Other diseases of stomach and duodenum                       R07.89, Other chest pain CPT copyright 2017 American Medical Association. All rights  reserved. The codes documented in this report are preliminary and upon coder review may  be revised to meet current compliance requirements. Dr. Ulyess Mort Lin Landsman MD, MD 09/07/2017 4:56:46 PM This report has been signed electronically. Number of Addenda: 0 Note Initiated On: 09/07/2017 4:42 PM      [facility]

## 2017-09-07 NOTE — Transfer of Care (Signed)
Immediate Anesthesia Transfer of Care Note  Patient: Susa SimmondsWayne T Campanella  Procedure(s) Performed: ESOPHAGOGASTRODUODENOSCOPY (EGD) WITH PROPOFOL (N/A )  Patient Location: PACU and Endoscopy Unit  Anesthesia Type:General  Level of Consciousness: awake, alert , oriented and patient cooperative  Airway & Oxygen Therapy: Patient Spontanous Breathing  Post-op Assessment: Report given to RN, Post -op Vital signs reviewed and stable and Patient moving all extremities  Post vital signs: Reviewed and stable  Last Vitals:  Vitals Value Taken Time  BP 117/87 09/07/2017  5:00 PM  Temp 36.1 C 09/07/2017  5:00 PM  Pulse 86 09/07/2017  5:02 PM  Resp 34 09/07/2017  5:02 PM  SpO2 93 % 09/07/2017  5:02 PM  Vitals shown include unvalidated device data.  Last Pain:  Vitals:   09/07/17 1700  TempSrc: Tympanic  PainSc: Asleep         Complications: No apparent anesthesia complications

## 2017-09-07 NOTE — H&P (Signed)
Arlyss Repressohini R Vicke Plotner, MD [address]  [address]  [place], [place] [address]  Main: [phone]  Fax: [phone] Pager: [phone]  Primary Care Physician:  Novella Oliveey, Raquel M, NP Primary Gastroenterologist:  Dr. Arlyss Repressohini R Zorana Brockwell  Pre-Procedure History & Physical: HPI:  Danny Patton is a 42 y.o. male is here for an endoscopy.   Past Medical History:  Diagnosis Date  . Chicken pox     History reviewed. No pertinent surgical history.  Prior to Admission medications   Medication Sig Start Date End Date Taking? Authorizing Provider  omeprazole (PRILOSEC OTC) 20 MG tablet Take 1 tablet (20 mg total) by mouth daily. 08/28/17 08/28/18 Yes Lorre MunroeBaity, Regina W, NP    Allergies as of 09/01/2017  . (No Known Allergies)    Family History  Problem Relation Age of Onset  . Stroke Maternal Grandmother   . Heart disease Paternal Grandfather     Social History   Socioeconomic History  . Marital status: Married    Spouse name: Not on file  . Number of children: 2  . Years of education: 8114  . Highest education level: Not on file  Occupational History  . Occupation: Maintenance    CommentGlass blower/designer: [employer]  Social Needs  . Financial resource strain: Not on file  . Food insecurity:    Worry: Not on file    Inability: Not on file  . Transportation needs:    Medical: Not on file    Non-medical: Not on file  Tobacco Use  . Smoking status: Former Smoker    Last attempt to quit: 03/30/1997    Years since quitting: 20.4  . Smokeless tobacco: Never Used  Substance and Sexual Activity  . Alcohol use: Yes    Alcohol/week: 2.4 oz    Types: 4 Cans of beer per week    Comment: occasionally  . Drug use: No  . Sexual activity: Yes  Lifestyle  . Physical activity:    Days per week: Not on file    Minutes per session: Not on file  . Stress: Not on file  Relationships  . Social connections:    Talks on phone: Not on file    Gets together: Not on file    Attends religious service: Not on  file    Active member of club or organization: Not on file    Attends meetings of clubs or organizations: Not on file    Relationship status: Not on file  . Intimate partner violence:    Fear of current or ex partner: Not on file    Emotionally abused: Not on file    Physically abused: Not on file    Forced sexual activity: Not on file  Other Topics Concern  . Not on file  Social History Narrative   Danny Pat[place] up [place], [place]. He attended [employer] and obtained an Associates degree in [employer].  He has 2 biological children from a previous relationship 55(40 y/o girl who lives with her mother & 7 month of girl from his current marriage). He lives at home with his wife and her 2611 & 549 y/o daughters and their 297 month old daughter. His wife also has a 40 y/o daughter who lives with a cousin and a 40 y/o son who lives with his dad. All live in [place] and they see each other frequently.  Danny Patton works in Production designer, theatre/television/filmmaintenance for [employer]. He has not been able to find employment in his  field of study.    Review of Systems: See HPI, otherwise negative ROS  Physical Exam: BP (!) 163/111   Pulse 70   Temp (!) 95.8 F (35.4 C) (Tympanic)   Resp 20   Ht 6\' 1"  (1.854 m)   Wt 185 lb (83.9 kg)   SpO2 100%   BMI 24.41 kg/m  General:   Alert,  pleasant and cooperative in NAD Head:  Normocephalic and atraumatic. Neck:  Supple; no masses or thyromegaly. Lungs:  Clear throughout to auscultation.    Heart:  Regular rate and rhythm. Abdomen:  Soft, nontender and nondistended. Normal bowel sounds, without guarding, and without rebound.   Neurologic:  Alert and  oriented x4;  grossly normal neurologically.  Impression/Plan: Danny Simmonds is here for an endoscopy to be performed for atypical chest pain  Risks, benefits, limitations, and alternatives regarding  endoscopy have been reviewed with the patient.  Questions have been answered.  All parties agreeable.   Lannette Donath, MD  09/07/2017, 2:49 PM

## 2017-09-07 NOTE — Anesthesia Post-op Follow-up Note (Signed)
Anesthesia QCDR form completed.        

## 2017-09-07 NOTE — Anesthesia Preprocedure Evaluation (Signed)
Anesthesia Evaluation  Patient identified by MRN, date of birth, ID band Patient awake    Reviewed: Allergy & Precautions, H&P , NPO status , reviewed documented beta blocker date and time   Airway Mallampati: II  TM Distance: >3 FB Neck ROM: full    Dental  (+) Poor Dentition, Chipped   Pulmonary former smoker,    Pulmonary exam normal        Cardiovascular Normal cardiovascular exam     Neuro/Psych    GI/Hepatic   Endo/Other    Renal/GU      Musculoskeletal   Abdominal   Peds  Hematology   Anesthesia Other Findings Past Medical History: Hx cocaine use - last use last week, > 3 days No date: Chicken pox  History reviewed. No pertinent surgical history.  BMI    Body Mass Index:  24.41 kg/m      Reproductive/Obstetrics                             Anesthesia Physical Anesthesia Plan  ASA: II  Anesthesia Plan: General   Post-op Pain Management:    Induction:   PONV Risk Score and Plan: 2 and Treatment may vary due to age or medical condition and TIVA  Airway Management Planned:   Additional Equipment:   Intra-op Plan:   Post-operative Plan:   Informed Consent: I have reviewed the patients History and Physical, chart, labs and discussed the procedure including the risks, benefits and alternatives for the proposed anesthesia with the patient or authorized representative who has indicated his/her understanding and acceptance.   Dental Advisory Given  Plan Discussed with: CRNA  Anesthesia Plan Comments:         Anesthesia Quick Evaluation

## 2017-09-08 ENCOUNTER — Encounter: Payer: Self-pay | Admitting: Gastroenterology

## 2017-09-09 LAB — SURGICAL PATHOLOGY

## 2017-09-13 ENCOUNTER — Encounter: Payer: Self-pay | Admitting: Gastroenterology

## 2017-09-21 ENCOUNTER — Telehealth: Payer: Self-pay | Admitting: Gastroenterology

## 2017-09-21 NOTE — Telephone Encounter (Signed)
Patient has been notified of EGD results

## 2017-09-21 NOTE — Telephone Encounter (Signed)
Pt is calling  For EGD results

## 2017-11-09 ENCOUNTER — Emergency Department
Admission: EM | Admit: 2017-11-09 | Discharge: 2017-11-09 | Disposition: A | Discharge status: Home / Self Care | Payer: Self-pay | Attending: Student in an Organized Health Care Education/Training Program | Admitting: Student in an Organized Health Care Education/Training Program

## 2017-11-09 ENCOUNTER — Encounter: Payer: Self-pay | Admitting: Emergency Medicine

## 2017-11-09 ENCOUNTER — Other Ambulatory Visit: Payer: Self-pay

## 2017-11-09 ENCOUNTER — Emergency Department: Payer: Self-pay

## 2017-11-09 DIAGNOSIS — F439 Reaction to severe stress, unspecified: Secondary | ICD-10-CM

## 2017-11-09 DIAGNOSIS — R0789 Other chest pain: Secondary | ICD-10-CM | POA: Insufficient documentation

## 2017-11-09 DIAGNOSIS — F430 Acute stress reaction: Secondary | ICD-10-CM | POA: Insufficient documentation

## 2017-11-09 DIAGNOSIS — Z87891 Personal history of nicotine dependence: Secondary | ICD-10-CM | POA: Insufficient documentation

## 2017-11-09 LAB — CBC
HCT: 47.3 % (ref 40.0–52.0)
HEMOGLOBIN: 16.2 g/dL (ref 13.0–18.0)
MCH: 29.2 pg (ref 26.0–34.0)
MCHC: 34.2 g/dL (ref 32.0–36.0)
MCV: 85.4 fL (ref 80.0–100.0)
Platelets: 183 10*3/uL (ref 150–440)
RBC: 5.54 MIL/uL (ref 4.40–5.90)
RDW: 14.2 % (ref 11.5–14.5)
WBC: 6.7 10*3/uL (ref 3.8–10.6)

## 2017-11-09 LAB — BASIC METABOLIC PANEL
ANION GAP: 7 (ref 5–15)
BUN: 19 mg/dL (ref 6–20)
CALCIUM: 9.3 mg/dL (ref 8.9–10.3)
CO2: 28 mmol/L (ref 22–32)
Chloride: 106 mmol/L (ref 98–111)
Creatinine, Ser: 1.31 mg/dL — ABNORMAL HIGH (ref 0.61–1.24)
Glucose, Bld: 91 mg/dL (ref 70–99)
Potassium: 4 mmol/L (ref 3.5–5.1)
Sodium: 141 mmol/L (ref 135–145)

## 2017-11-09 LAB — TROPONIN I

## 2017-11-09 MED ORDER — LORAZEPAM 1 MG PO TABS
1.0000 mg | ORAL_TABLET | Freq: Once | ORAL | Status: AC
  Administered 2017-11-09: 1 mg via ORAL
  Filled 2017-11-09: qty 1

## 2017-11-09 MED ORDER — LORAZEPAM 1 MG PO TABS: 1.0000 mg | ORAL_TABLET | Freq: Three times a day (TID) | ORAL | 0 refills | Status: AC | PRN

## 2017-11-09 NOTE — ED Triage Notes (Signed)
Patient reports intermittent chest pain "for a while". Reports sharp pain to left side of chest and radiating to left arm. Patient also reports increasing panic attacks. Reports some shortness of breath during episodes.

## 2017-11-09 NOTE — ED Notes (Signed)
ED Provider at bedside. 

## 2017-11-09 NOTE — ED Provider Notes (Signed)
[facility] Emergency Department Provider Note    First MD Initiated Contact with Patient 11/09/17 1430     (approximate)  I have reviewed the triage vital signs and the nursing notes.   HISTORY  Chief Complaint Chest Pain    HPI Danny Patton is a 42 y.o. male history of anxiety self diagnosed not on any medication presents to the ER with intermittent episodes of chest pain that are brief in nature.  Describes the pain is electric-like shock with palpitations and does feel some shortness of breath.  States that he will have to get up and walk around or even splash water in his face to try to calm down.  States he has been having these episodes for a long time.  States is been getting worse over the past to 3 weeks.  Denies any fevers.  No cough.  Does not have a history of hypertension diabetes or high cholesterol.  He did quit smoking roughly 4 months ago.  Denies any nausea or vomiting.  No SI or HI.  Denies any chest pain right now.  Last episode was this morning around 10:00.     Past Medical History:  Diagnosis Date  . Chicken pox    Family History  Problem Relation Age of Onset  . Stroke Maternal Grandmother   . Heart disease Paternal Grandfather    Past Surgical History:  Procedure Laterality Date  . ESOPHAGOGASTRODUODENOSCOPY (EGD) WITH PROPOFOL N/A 09/07/2017   Procedure: ESOPHAGOGASTRODUODENOSCOPY (EGD) WITH PROPOFOL;  Surgeon: Toney ReilVanga, Rohini Reddy, MD;  Location: [facility] ENDOSCOPY;  Service: Gastroenterology;  Laterality: N/A;   Patient Active Problem List   Diagnosis Date Noted  . Atypical chest pain   . Dysuria 03/21/2013  . Pre-employment examination 03/21/2013  . Routine general medical examination at a health care facility 12/05/2012  . Pain in both feet 12/05/2012      Prior to Admission medications   Medication Sig Start Date End Date Taking? Authorizing Provider  LORazepam (ATIVAN) 1 MG tablet Take 1 tablet (1 mg total) by  mouth every 8 (eight) hours as needed for anxiety. 11/09/17 11/09/18  Willy Eddyobinson, Thurmond Hildebran, MD  omeprazole (PRILOSEC OTC) 20 MG tablet Take 1 tablet (20 mg total) by mouth daily. 08/28/17 08/28/18  Lorre MunroeBaity, Regina W, NP    Allergies Patient has no known allergies.    Social History Social History   Tobacco Use  . Smoking status: Former Smoker    Last attempt to quit: 03/30/1997    Years since quitting: 20.6  . Smokeless tobacco: Never Used  Substance Use Topics  . Alcohol use: Yes    Alcohol/week: 4.0 standard drinks    Types: 4 Cans of beer per week    Comment: occasionally  . Drug use: Not Currently    Review of Systems Patient denies headaches, rhinorrhea, blurry vision, numbness, shortness of breath, chest pain, edema, cough, abdominal pain, nausea, vomiting, diarrhea, dysuria, fevers, rashes or hallucinations unless otherwise stated above in HPI. ____________________________________________   PHYSICAL EXAM:  VITAL SIGNS: Vitals:   11/09/17 1531 11/09/17 1612  BP: (!) 128/96 (!) 121/95  Pulse: (!) 57 62  Resp: 14 14  Temp:    SpO2: 100% 99%    Constitutional: Alert and oriented.  Eyes: Conjunctivae are normal.  Head: Atraumatic. Nose: No congestion/rhinnorhea. Mouth/Throat: Mucous membranes are moist.   Neck: No stridor. Painless ROM.  Cardiovascular: Normal rate, regular rhythm. Grossly normal heart sounds.  Good peripheral circulation. Respiratory: Normal respiratory effort.  No retractions. Lungs CTAB. Gastrointestinal: Soft and nontender. No distention. No abdominal bruits. No CVA tenderness. Genitourinary:  Musculoskeletal: No lower extremity tenderness nor edema.  No joint effusions. Neurologic:  Normal speech and language. No gross focal neurologic deficits are appreciated. No facial droop Skin:  Skin is warm, dry and intact. No rash noted. Psychiatric: Mood and affect are normal. Speech and behavior are normal.  ____________________________________________     LABS (all labs ordered are listed, but only abnormal results are displayed)  Results for orders placed or performed during the hospital encounter of 11/09/17 (from the past 24 hour(s))  Basic metabolic panel     Status: Abnormal   Collection Time: 11/09/17 12:16 PM  Result Value Ref Range   Sodium 141 135 - 145 mmol/L   Potassium 4.0 3.5 - 5.1 mmol/L   Chloride 106 98 - 111 mmol/L   CO2 28 22 - 32 mmol/L   Glucose, Bld 91 70 - 99 mg/dL   BUN 19 6 - 20 mg/dL   Creatinine, Ser 0.981.31 (H) 0.61 - 1.24 mg/dL   Calcium 9.3 8.9 - 11.910.3 mg/dL   GFR calc non Af Amer >60 >60 mL/min   GFR calc Af Amer >60 >60 mL/min   Anion gap 7 5 - 15  CBC     Status: None   Collection Time: 11/09/17 12:16 PM  Result Value Ref Range   WBC 6.7 3.8 - 10.6 K/uL   RBC 5.54 4.40 - 5.90 MIL/uL   Hemoglobin 16.2 13.0 - 18.0 g/dL   HCT 14.747.3 82.940.0 - 56.252.0 %   MCV 85.4 80.0 - 100.0 fL   MCH 29.2 26.0 - 34.0 pg   MCHC 34.2 32.0 - 36.0 g/dL   RDW 13.014.2 86.511.5 - 78.414.5 %   Platelets 183 150 - 440 K/uL  Troponin I     Status: None   Collection Time: 11/09/17 12:16 PM  Result Value Ref Range   Troponin I <0.03 <0.03 ng/mL  Troponin I     Status: None   Collection Time: 11/09/17  3:30 PM  Result Value Ref Range   Troponin I <0.03 <0.03 ng/mL   ____________________________________________  EKG My review and personal interpretation at Time: 12:08   Indication: chest pain  Rate: 75  Rhythm: sinus Axis: normal Other: concave upwards st abnormality, likely BER ____________________________________________  RADIOLOGY  I personally reviewed all radiographic images ordered to evaluate for the above acute complaints and reviewed radiology reports and findings.  These findings were personally discussed with the patient.  Please see medical record for radiology report.  ____________________________________________   PROCEDURES  Procedure(s) performed:  Procedures    Critical Care performed:  no ____________________________________________   INITIAL IMPRESSION / ASSESSMENT AND PLAN / ED COURSE  Pertinent labs & imaging results that were available during my care of the patient were reviewed by me and considered in my medical decision making (see chart for details).   DDX: ACS, pericarditis, esophagitis, boerhaaves, pe, dissection, pna, bronchitis, costochondritis   Danny Patton is a 42 y.o. who presents to the ED with symptoms as described above.  Patient is AFVSS in ED. Exam as above. Given current presentation have considered the above differential.  The patient will be placed on continuous pulse oximetry and telemetry for monitoring.  Laboratory evaluation will be sent to evaluate for the above complaints.  EKG shows some nonspecific abnormality there is consistent with his previous and more suggestive of benign early re-pole.  Patient is low risk  heart score.  Will repeat troponin to further risk stratify given pain onset this morning.  Blood work otherwise reassuring.  Seems most clinically consistent with anxiety or stress.  Discussed follow-up with cardiology for possible Holter monitor if symptoms persist.  Patient will be discharged with outpatient follow-up assuming repeat troponin negative.      As part of my medical decision making, I reviewed the following data within the electronic MEDICAL RECORD NUMBER Nursing notes reviewed and incorporated, Labs reviewed, notes from prior ED visits .  ____________________________________________   FINAL CLINICAL IMPRESSION(S) / ED DIAGNOSES  Final diagnoses:  Atypical chest pain  Stress      NEW MEDICATIONS STARTED DURING THIS VISIT:  New Prescriptions   LORAZEPAM (ATIVAN) 1 MG TABLET    Take 1 tablet (1 mg total) by mouth every 8 (eight) hours as needed for anxiety.     Note:  This document was prepared using Dragon voice recognition software and may include unintentional dictation errors.    Willy Eddy,  MD 11/09/17 [phone]

## 2019-01-17 ENCOUNTER — Other Ambulatory Visit: Payer: Self-pay

## 2019-01-17 DIAGNOSIS — Z20822 Contact with and (suspected) exposure to covid-19: Secondary | ICD-10-CM

## 2019-01-18 LAB — NOVEL CORONAVIRUS, NAA: SARS-CoV-2, NAA: NOT DETECTED

## 2019-09-01 IMAGING — CR DG CHEST 2V
1 series · 2 of 2 positions shown · non-contrast
Comparison: CT chest 10/22/2007.

CLINICAL DATA: Intermittent chest pain, chronic.

EXAM:
CHEST - 2 VIEW

[Series 1: dg chest 2 view · 0.14mm/px · 2 of 2 slices shown]
[im 1/2]
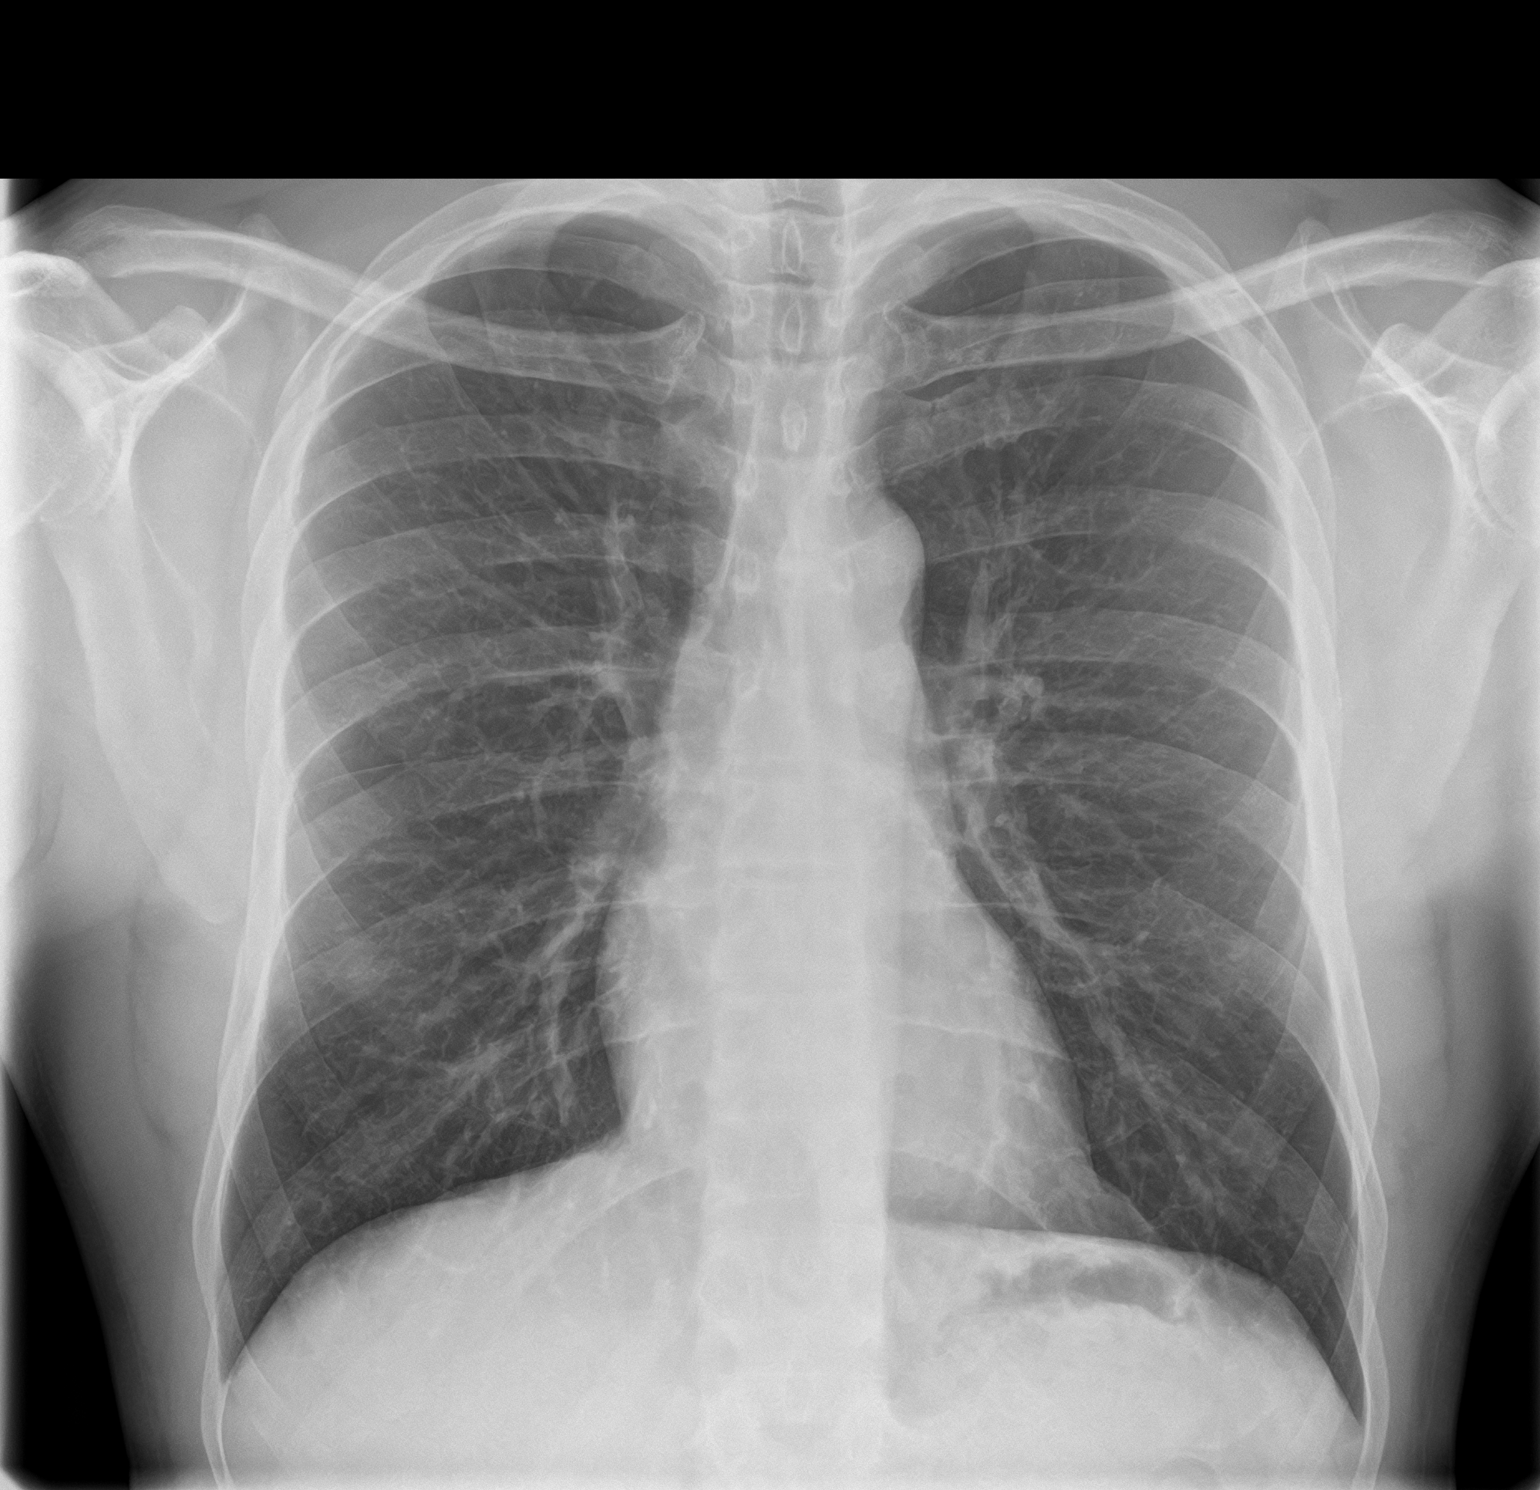
[im 2/2]
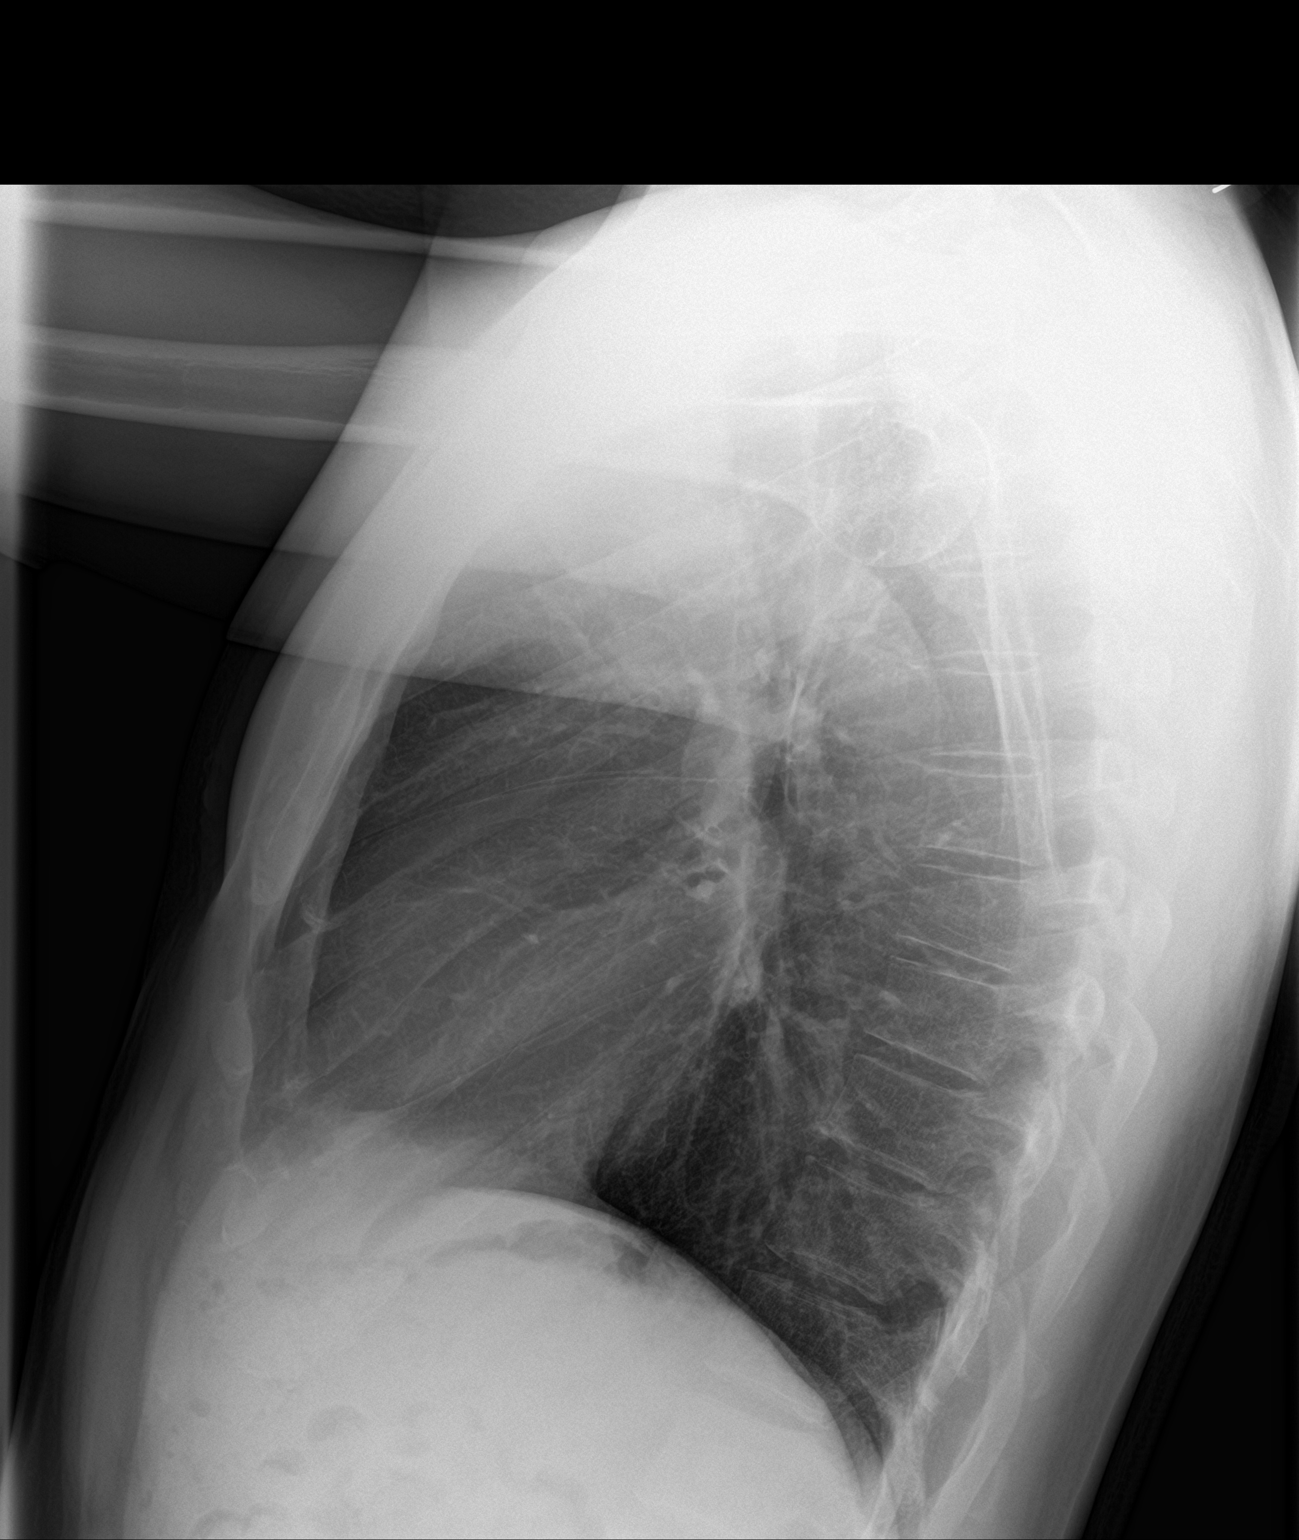

[2 of 2 positions shown; findings below may reference images not displayed]

FINDINGS: Lungs are clear. Heart size is normal. No pneumothorax or pleural
effusion. No bony abnormality.
IMPRESSION: Negative chest.

## 2020-04-17 ENCOUNTER — Emergency Department
Admission: EM | Admit: 2020-04-17 | Discharge: 2020-04-17 | Disposition: A | Discharge status: Home / Self Care | Payer: Self-pay | Attending: Emergency Medicine | Admitting: Emergency Medicine

## 2020-04-17 ENCOUNTER — Other Ambulatory Visit: Payer: Self-pay

## 2020-04-17 DIAGNOSIS — Z20822 Contact with and (suspected) exposure to covid-19: Secondary | ICD-10-CM | POA: Insufficient documentation

## 2020-04-17 DIAGNOSIS — Z87891 Personal history of nicotine dependence: Secondary | ICD-10-CM | POA: Insufficient documentation

## 2020-04-17 DIAGNOSIS — J069 Acute upper respiratory infection, unspecified: Secondary | ICD-10-CM | POA: Insufficient documentation

## 2020-04-17 LAB — GROUP A STREP BY PCR: Group A Strep by PCR: NOT DETECTED

## 2020-04-17 LAB — SARS CORONAVIRUS 2 (TAT 6-24 HRS): SARS Coronavirus 2: NEGATIVE

## 2020-04-17 NOTE — ED Notes (Signed)
See triage note  States he developed a sore throat about 1 week ago  States pain is worse and is mainly on the right side  Denies any fever at home and is afebrile on arrival

## 2020-04-17 NOTE — ED Triage Notes (Signed)
Pt c/o sore throat for the past week, denies fever or other sx.

## 2020-04-17 NOTE — ED Provider Notes (Signed)
[facility] Emergency Department Provider Note   ____________________________________________   Event Date/Time   First MD Initiated Contact with Patient 04/17/20 1208     (approximate)  I have reviewed the triage vital signs and the nursing notes.   HISTORY  Chief Complaint Sore Throat   HPI Danny Patton is a 45 y.o. male presents to the ED with complaint of sore throat for approximately 1 week.  Denies any fever or chills.  There is been no history of cough, congestion, change in taste or smell.  He denies any nausea, vomiting or diarrhea.  Patient is a non-smoker and states he had the Pfizer COVID vaccine.  Rates his pain as an 8 out of 10.       Past Medical History:  Diagnosis Date  . Chicken pox     Patient Active Problem List   Diagnosis Date Noted  . Atypical chest pain   . Dysuria 03/21/2013  . Pre-employment examination 03/21/2013  . Routine general medical examination at a health care facility 12/05/2012  . Pain in both feet 12/05/2012    Past Surgical History:  Procedure Laterality Date  . ESOPHAGOGASTRODUODENOSCOPY (EGD) WITH PROPOFOL N/A 09/07/2017   Procedure: ESOPHAGOGASTRODUODENOSCOPY (EGD) WITH PROPOFOL;  Surgeon: Toney Reil, MD;  Location: [facility] ENDOSCOPY;  Service: Gastroenterology;  Laterality: N/A;    Prior to Admission medications   Medication Sig Start Date End Date Taking? Authorizing Provider  omeprazole (PRILOSEC OTC) 20 MG tablet Take 1 tablet (20 mg total) by mouth daily. 08/28/17 08/28/18  Lorre Munroe, NP    Allergies Patient has no known allergies.  Family History  Problem Relation Age of Onset  . Stroke Maternal Grandmother   . Heart disease Paternal Grandfather     Social History Social History   Tobacco Use  . Smoking status: Former Smoker    Quit date: 03/30/1997    Years since quitting: 23.0  . Smokeless tobacco: Never Used  Vaping Use  . Vaping Use: Former  Substance Use Topics   . Alcohol use: Yes    Alcohol/week: 4.0 standard drinks    Types: 4 Cans of beer per week    Comment: occasionally  . Drug use: Not Currently    Review of Systems Constitutional: No fever/chills Eyes: No visual changes. ENT: Positive sore throat. Cardiovascular: Denies chest pain. Respiratory: Denies shortness of breath. Gastrointestinal: No abdominal pain.  No nausea, no vomiting.  No diarrhea.   Musculoskeletal: Negative for muscle aches. Skin: Negative for rash. Neurological: Negative for headaches, focal weakness or numbness. ____________________________________________   PHYSICAL EXAM:  VITAL SIGNS: ED Triage Vitals  Enc Vitals Group     BP 04/17/20 0936 132/89     Pulse Rate 04/17/20 0936 96     Resp 04/17/20 0936 18     Temp 04/17/20 0936 98 F (36.7 C)     Temp Source 04/17/20 0936 Oral     SpO2 04/17/20 0936 97 %     Weight 04/17/20 0937 195 lb (88.5 kg)     Height 04/17/20 0937 6\' 1"  (1.854 m)     Head Circumference --      Peak Flow --      Pain Score 04/17/20 0942 8     Pain Loc --      Pain Edu? --      Excl. in GC? --     Constitutional: Alert and oriented. Well appearing and in no acute distress. Eyes: Conjunctivae are normal.  Head: Atraumatic. Nose: No congestion/rhinnorhea. Mouth/Throat: Mucous membranes are moist.  Oropharynx non-erythematous.  No exudate is noted and uvula is midline. Neck: No stridor.   Hematological/Lymphatic/Immunilogical: No cervical lymphadenopathy. Cardiovascular: Normal rate, regular rhythm. Grossly normal heart sounds.  Good peripheral circulation. Respiratory: Normal respiratory effort.  No retractions. Lungs CTAB. Gastrointestinal: Soft and nontender. No distention.  Musculoskeletal: Moves upper and lower extremities without any difficulty.  Normal gait was noted. Neurologic:  Normal speech and language. No gross focal neurologic deficits are appreciated. No gait instability. Skin:  Skin is warm, dry and intact. No  rash noted. Psychiatric: Mood and affect are normal. Speech and behavior are normal.  ____________________________________________   LABS (all labs ordered are listed, but only abnormal results are displayed)  Labs Reviewed  GROUP A STREP BY PCR  SARS CORONAVIRUS 2 (TAT 6-24 HRS)     PROCEDURES  Procedure(s) performed (including Critical Care):  Procedures   ____________________________________________   INITIAL IMPRESSION / ASSESSMENT AND PLAN / ED COURSE  As part of my medical decision making, I reviewed the following data within the electronic MEDICAL RECORD NUMBER Notes from prior ED visits and [place] Controlled Substance Database  45 year old male presents to the ED with complaint of sore throat for the past week without fever or other symptoms. Patient denies any cough, fever, chills, nausea, vomiting or diarrhea. Patient is unaware of any known exposure to COVID. A COVID swab was done after his strep test was negative. He is aware that he will need to check MyChart for the results of this test. Also if his test is positive he will need to contact people that he has been around and also quarantine. A note to this to effect was given to him prior to leaving. He is also aware should he develop any shortness of breath or difficulty breathing with a positive COVID test he is to return to the emergency department.   ____________________________________________   FINAL CLINICAL IMPRESSION(S) / ED DIAGNOSES  Final diagnoses:  Viral upper respiratory tract infection     ED Discharge Orders    None      *Please note:  [name] was evaluated in Emergency Department on 04/17/2020 for the symptoms described in the history of present illness. He was evaluated in the context of the global COVID-19 pandemic, which necessitated consideration that the patient might be at risk for infection with the SARS-CoV-2 virus that causes COVID-19. Institutional protocols and algorithms that  pertain to the evaluation of patients at risk for COVID-19 are in a state of rapid change based on information released by regulatory bodies including the CDC and federal and state organizations. These policies and algorithms were followed during the patient's care in the ED.  Some ED evaluations and interventions may be delayed as a result of limited staffing during and the pandemic.*   Note:  This document was prepared using Dragon voice recognition software and may include unintentional dictation errors.    Tommi Rumps, PA-C 04/17/20 1516    Minna Antis, MD 04/17/20 [phone]

## 2020-04-17 NOTE — Discharge Instructions (Signed)
Follow-up with Dr. Willeen Cass who is on-call for [facility] if you continue to have problems with your throat.  You may obtain over-the-counter decongestant such as Mucinex for any congestion that you are experiencing which may help with your throat pain.  Tylenol or ibuprofen as needed for throat pain.  Increase fluids.  The strep test was negative.  You will be able to see the results of your COVID test on MyChart which should be there within the next 6 to 24 hours.  If your COVID test is positive you will need to quarantine at home.  Also notify people that you have been around that you are positive.
